# Patient Record
Sex: Male | Born: 1940 | Race: Black or African American | Hispanic: No | Marital: Single | Smoking: Never smoker
Health system: Southern US, Community
[De-identification: ages and names within clinical notes are randomized; demographics above are authoritative.]

## PROBLEM LIST (undated history)

## (undated) DIAGNOSIS — I10 Essential (primary) hypertension: Secondary | ICD-10-CM

## (undated) DIAGNOSIS — M109 Gout, unspecified: Secondary | ICD-10-CM

---

## 2005-09-20 ENCOUNTER — Other Ambulatory Visit: Payer: Self-pay

## 2005-09-20 ENCOUNTER — Inpatient Hospital Stay: Payer: Self-pay | Admitting: Internal Medicine

## 2005-09-21 ENCOUNTER — Other Ambulatory Visit: Payer: Self-pay

## 2005-10-30 ENCOUNTER — Other Ambulatory Visit: Payer: Self-pay

## 2005-10-30 ENCOUNTER — Inpatient Hospital Stay: Payer: Self-pay | Admitting: Internal Medicine

## 2005-10-31 ENCOUNTER — Other Ambulatory Visit: Payer: Self-pay

## 2006-02-26 ENCOUNTER — Emergency Department: Payer: Self-pay | Admitting: Emergency Medicine

## 2006-02-26 ENCOUNTER — Other Ambulatory Visit: Payer: Self-pay

## 2006-09-06 ENCOUNTER — Emergency Department: Payer: Self-pay | Admitting: Emergency Medicine

## 2006-09-06 ENCOUNTER — Other Ambulatory Visit: Payer: Self-pay

## 2007-11-01 IMAGING — CT CT CHEST W/ CM
1 series · 15 of 32 positions shown, 19 images · non-contrast
Comparison: none

REASON FOR EXAM: PE
COMMENTS:

[Series 4: soft tissue · axial · 0.74mm/px · z∈[-143,+163]mm · 15 of 116 slices shown, 19 images]
[im 9/116  mediastinal]
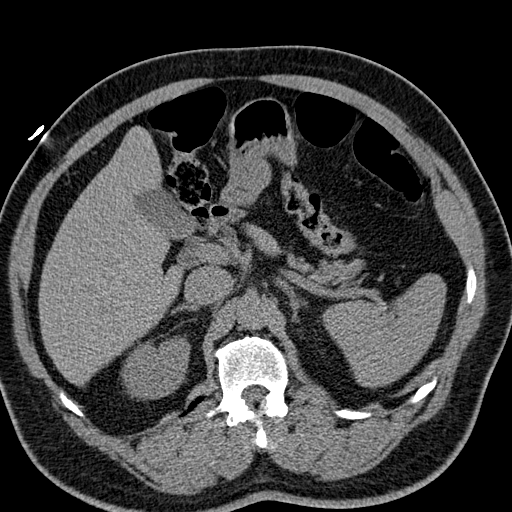
[im 9/116  lung]
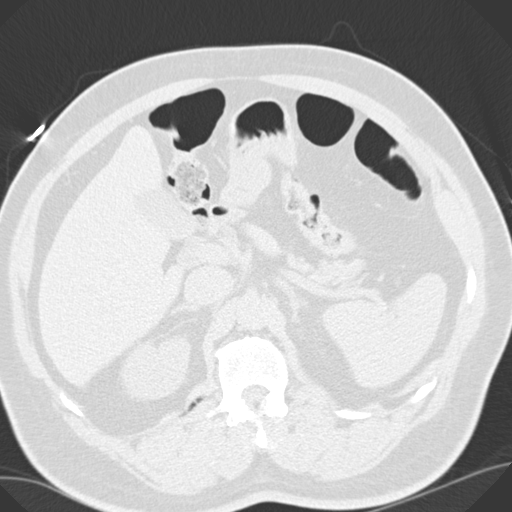
[im 18/116  lung]
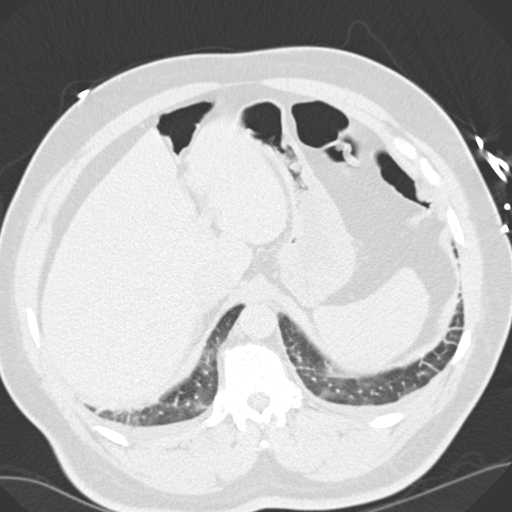
[im 24/116  lung]
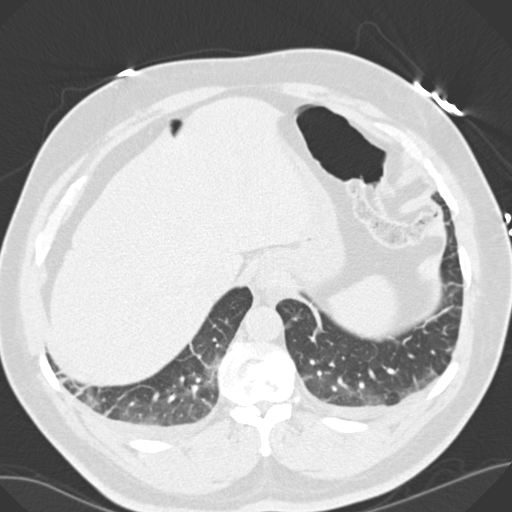
[im 30/116  lung]
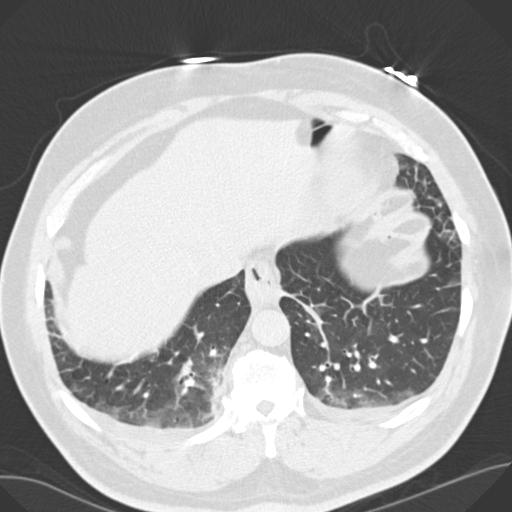
[im 39/116  mediastinal]
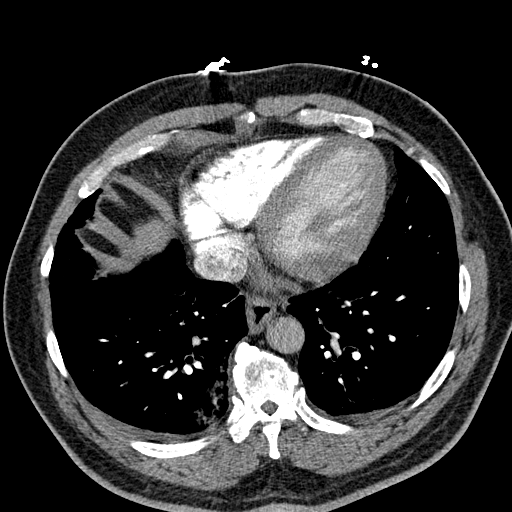
[im 39/116  lung]
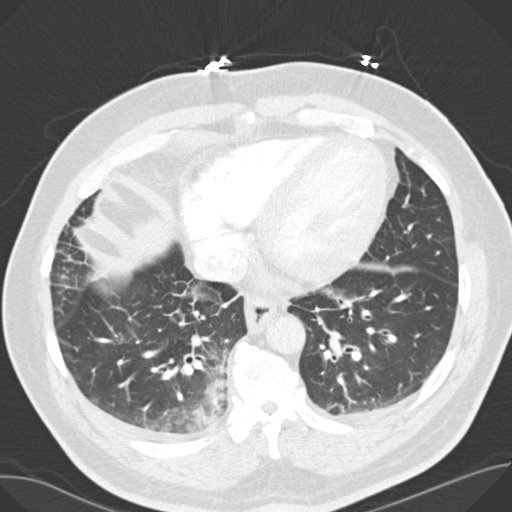
[im 47/116  lung]
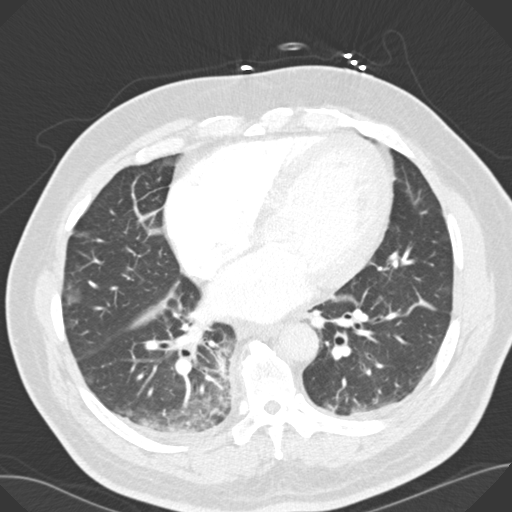
[im 56/116  lung]
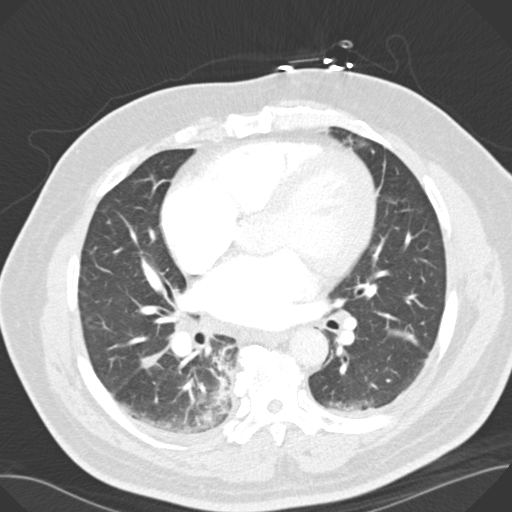
[im 61/116  lung]
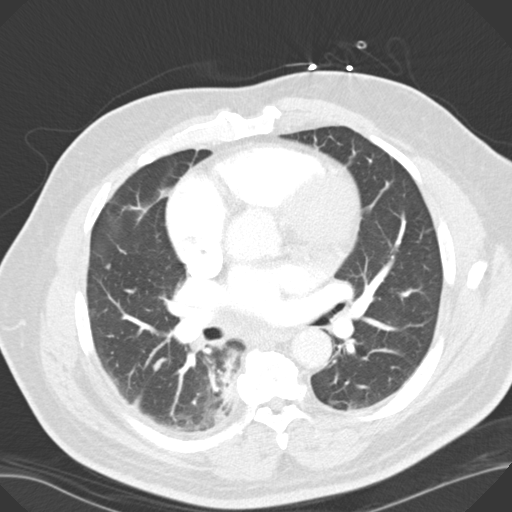
[im 69/116  mediastinal]
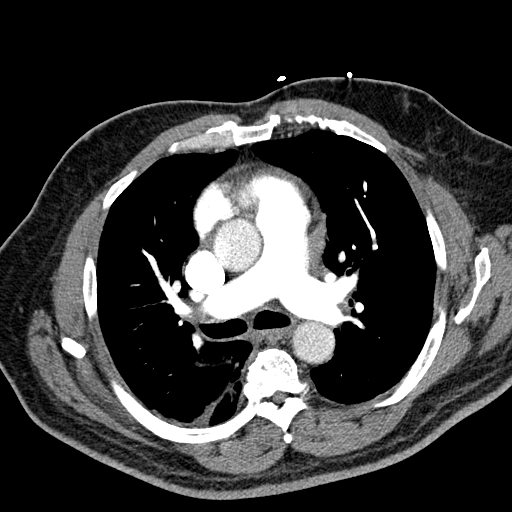
[im 69/116  lung]
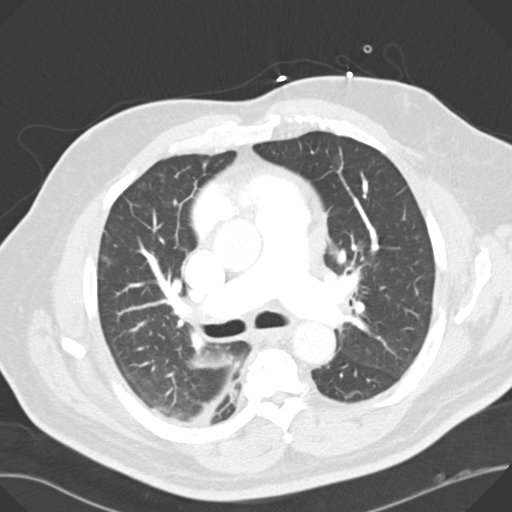
[im 73/116  lung]
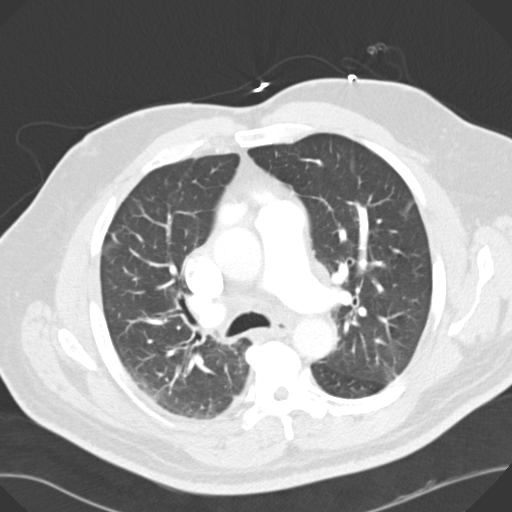
[im 81/116  lung]
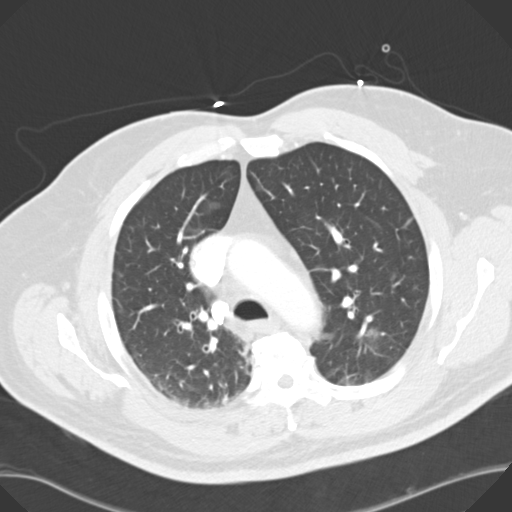
[im 90/116  lung]
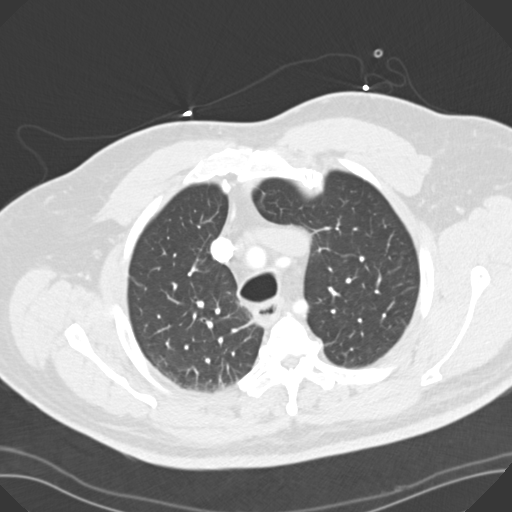
[im 94/116  mediastinal]
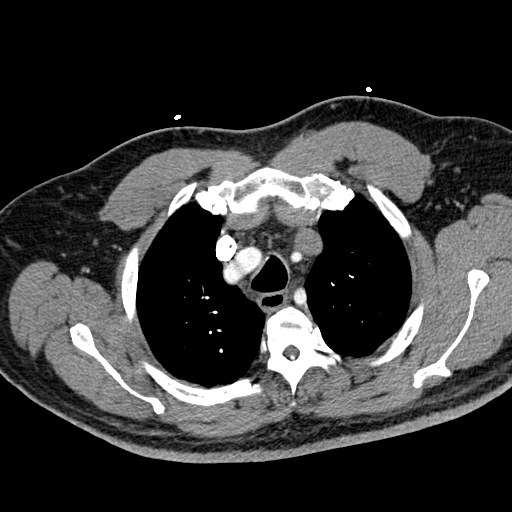
[im 94/116  lung]
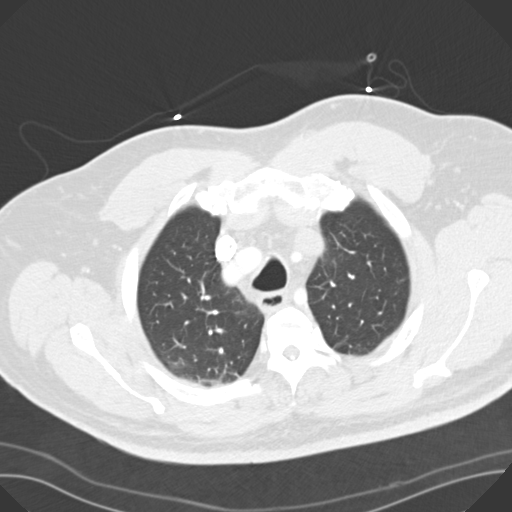
[im 103/116  lung]
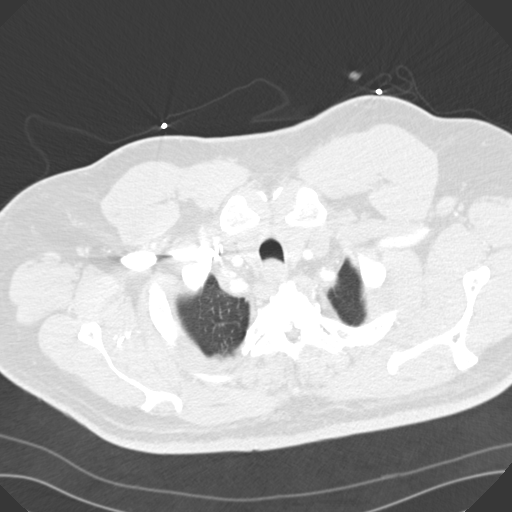
[im 111/116  lung]
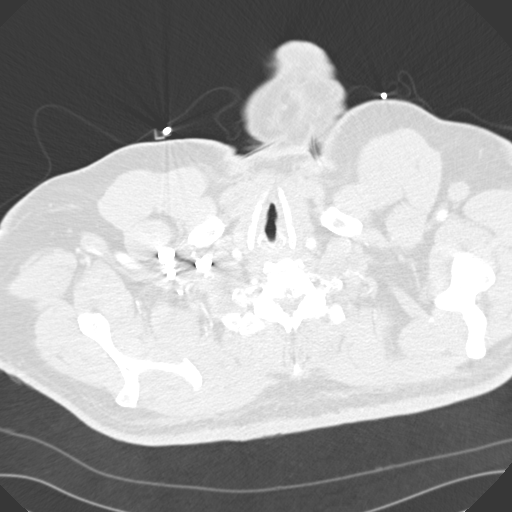

[15 of 32 positions shown; findings below may reference images not displayed]

PROCEDURE:     CT  - CT CHEST (FOR PE) W  - September 20, 2005  [DATE]

RESULT:          The patient is being evaluated for possible acute pulmonary
embolism.  The patient received 100 mL of Jsovue-TDS.

Contrast within the pulmonary arterial tree is normal in appearance.  I do
not see evidence of an acute pulmonary embolism.  The cardiac chambers are
top normal in size.  There is no pleural or pericardial effusion.  I see no
pathologic-sized mediastinal or hilar lymph nodes.  The caliber of the
thoracic aorta is normal.

At lung window settings, there is subpleural increased density in both lower
lobes, but this is predominantly on the RIGHT posteriorly.  A trace of
pleural fluid is noted in the costophrenic gutters.  I do not see alveolar
infiltrates.  Within the upper abdomen, the observed portions of the liver
are normal.  There are no adrenal masses.
IMPRESSION: 1.     I do not see evidence of acute pulmonary embolism.
2.     The cardiac chambers are top normal in size to mildly enlarged.
There is a trace of pleural fluid in both posterior costophrenic gutters.
3.     There is minimal atelectasis-interstitial infiltrate in the lower
lobes, especially on the RIGHT.
4.     There is a hiatal hernia present.

The findings were called to Dr. Thiery, in the emergency room, at the
conclusion of the study.

## 2007-12-11 IMAGING — CT CT LUMBAR SPINE WITHOUT CONTRAST
1 of 5 series · 2 of 14 positions shown, 3 images · non-contrast
Comparison: none

REASON FOR EXAM: pain/ weakness
COMMENTS:

[Series 606: range 5 · axial · 0.45mm/px · z∈[-417,-404]mm · 2 of 31 slices shown, 3 images]
[im 1/31  soft-tissue]
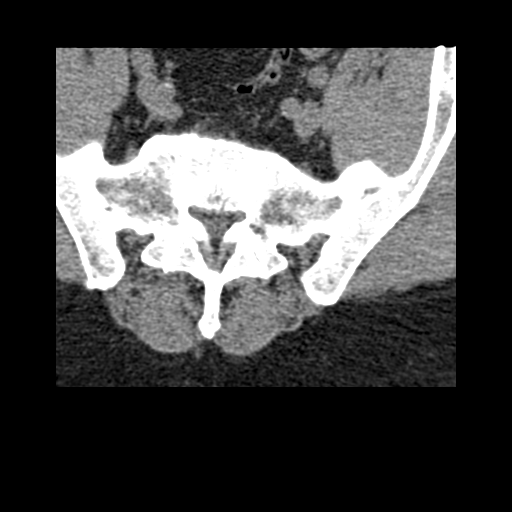
[im 1/31  bone]
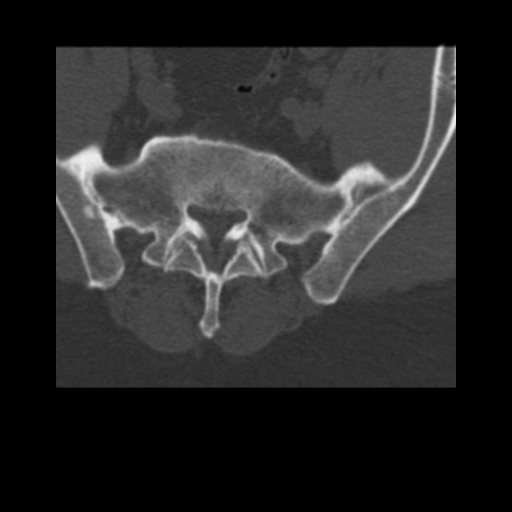
[im 16/31  bone]
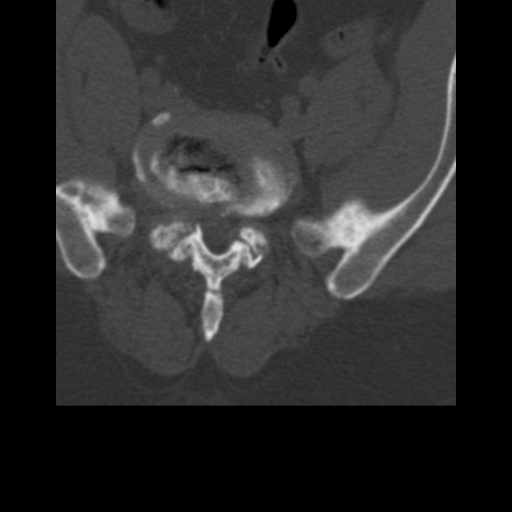

[2 of 14 positions shown; findings below may reference images not displayed]

PROCEDURE:     CT  - CT LUMBAR SPINE WO  - October 30, 2005  [DATE]

RESULT:        The patient is complaining of lower extremity weakness.

The lumbar vertebral bodies are preserved in height. There is disc space
narrowing at multiple levels with vacuum phenomenon seen at L3-L4. There are
Schmorl's nodes involving the inferior endplates of  L1 and L2. There is
endplate spurring seen at multiple levels, and there is facet joint
degenerative change present at multiple levels. These findings result in
moderate spinal stenosis and neural foraminal encroachment at multiple
levels. There is no discrete herniated nucleus pulposus identified. There
are rounded lucencies noted within the body of L5 that are nonspecific.
IMPRESSION: 1.     I do not see evidence of a compression fracture.
2.     There is degenerative disc disease and degenerative facet joint
change contributing to moderate degrees of spinal stenosis and neural
foraminal encroachment at multiple levels.
3.     There is lucency in the body of L5 that is nonspecific, but in the
face of any history of malignancy, the possibility of metastatic disease
should be considered. It would be of value to consider the patient for MRI
or nuclear bone scanning when he can tolerate procedure.

## 2008-06-20 ENCOUNTER — Ambulatory Visit: Payer: Self-pay | Admitting: Gastroenterology

## 2009-02-17 ENCOUNTER — Emergency Department: Payer: Self-pay | Admitting: Emergency Medicine

## 2012-04-25 ENCOUNTER — Emergency Department: Payer: Self-pay | Admitting: Emergency Medicine

## 2012-04-25 LAB — COMPREHENSIVE METABOLIC PANEL
Albumin: 4.1 g/dL (ref 3.4–5.0)
Alkaline Phosphatase: 108 U/L (ref 50–136)
Anion Gap: 5 — ABNORMAL LOW (ref 7–16)
BUN: 22 mg/dL — ABNORMAL HIGH (ref 7–18)
Bilirubin,Total: 0.6 mg/dL (ref 0.2–1.0)
Calcium, Total: 9.4 mg/dL (ref 8.5–10.1)
Chloride: 108 mmol/L — ABNORMAL HIGH (ref 98–107)
Co2: 25 mmol/L (ref 21–32)
EGFR (African American): >60
Glucose: 119 mg/dL — ABNORMAL HIGH (ref 65–99)
Osmolality: 280 (ref 275–301)
SGOT(AST): 38 U/L — ABNORMAL HIGH (ref 15–37)
Sodium: 138 mmol/L (ref 136–145)
Total Protein: 8.6 g/dL — ABNORMAL HIGH (ref 6.4–8.2)

## 2012-04-25 LAB — CBC
MCH: 30.1 pg (ref 26.0–34.0)
MCHC: 32.9 g/dL (ref 32.0–36.0)
RBC: 5.59 10*6/uL (ref 4.40–5.90)
RDW: 14 % (ref 11.5–14.5)
WBC: 10.7 10*3/uL — ABNORMAL HIGH (ref 3.8–10.6)

## 2012-04-25 LAB — URINALYSIS, COMPLETE
Bilirubin,UR: NEGATIVE
Glucose,UR: NEGATIVE mg/dL (ref 0–75)
Leukocyte Esterase: NEGATIVE
Nitrite: NEGATIVE
Protein: 100
RBC,UR: 11 /HPF (ref 0–5)
Squamous Epithelial: 2

## 2012-05-17 ENCOUNTER — Ambulatory Visit: Payer: Self-pay | Admitting: Urology

## 2012-11-23 ENCOUNTER — Emergency Department: Payer: Self-pay | Admitting: Internal Medicine

## 2012-11-23 LAB — CBC
HCT: 44.4 % (ref 40.0–52.0)
HGB: 14.7 g/dL (ref 13.0–18.0)
MCH: 29.4 pg (ref 26.0–34.0)
MCV: 89 fL (ref 80–100)
Platelet: 324 10*3/uL (ref 150–440)
RBC: 5 10*6/uL (ref 4.40–5.90)
WBC: 12.7 10*3/uL — ABNORMAL HIGH (ref 3.8–10.6)

## 2012-11-23 LAB — URINALYSIS, COMPLETE
Bacteria: NONE SEEN
Bilirubin,UR: NEGATIVE
Nitrite: NEGATIVE
Ph: 5 (ref 4.5–8.0)
Squamous Epithelial: <1
WBC UR: 14 /HPF (ref 0–5)

## 2012-11-23 LAB — BASIC METABOLIC PANEL
Calcium, Total: 9.9 mg/dL (ref 8.5–10.1)
Co2: 25 mmol/L (ref 21–32)
EGFR (Non-African Amer.): 47 — ABNORMAL LOW
Glucose: 134 mg/dL — ABNORMAL HIGH (ref 65–99)
Osmolality: 283 (ref 275–301)
Potassium: 3.2 mmol/L — ABNORMAL LOW (ref 3.5–5.1)
Sodium: 139 mmol/L (ref 136–145)

## 2012-11-23 LAB — TROPONIN I: Troponin-I: <0.02 ng/mL

## 2012-11-25 LAB — URINE CULTURE

## 2014-01-23 ENCOUNTER — Observation Stay: Payer: Self-pay | Admitting: Internal Medicine

## 2014-01-23 LAB — CBC
HCT: 46.7 % (ref 40.0–52.0)
HGB: 15.2 g/dL (ref 13.0–18.0)
MCH: 30 pg (ref 26.0–34.0)
MCHC: 32.7 g/dL (ref 32.0–36.0)
MCV: 92 fL (ref 80–100)
PLATELETS: 186 10*3/uL (ref 150–440)
RBC: 5.08 10*6/uL (ref 4.40–5.90)
RDW: 13.7 % (ref 11.5–14.5)
WBC: 16.2 10*3/uL — ABNORMAL HIGH (ref 3.8–10.6)

## 2014-01-23 LAB — COMPREHENSIVE METABOLIC PANEL
ALK PHOS: 89 U/L
AST: 49 U/L — AB (ref 15–37)
Albumin: 2.9 g/dL — ABNORMAL LOW (ref 3.4–5.0)
Anion Gap: 11 (ref 7–16)
BILIRUBIN TOTAL: 1.7 mg/dL — AB (ref 0.2–1.0)
BUN: 25 mg/dL — ABNORMAL HIGH (ref 7–18)
CREATININE: 1.89 mg/dL — AB (ref 0.60–1.30)
Calcium, Total: 9.5 mg/dL (ref 8.5–10.1)
Chloride: 102 mmol/L (ref 98–107)
Co2: 25 mmol/L (ref 21–32)
GFR CALC AF AMER: 45 — AB
GFR CALC NON AF AMER: 37 — AB
GLUCOSE: 131 mg/dL — AB (ref 65–99)
Osmolality: 282 (ref 275–301)
Potassium: 3.2 mmol/L — ABNORMAL LOW (ref 3.5–5.1)
SGPT (ALT): 35 U/L
SODIUM: 138 mmol/L (ref 136–145)
TOTAL PROTEIN: 8 g/dL (ref 6.4–8.2)

## 2014-01-23 LAB — TROPONIN I
Troponin-I: <0.02 ng/mL
Troponin-I: <0.02 ng/mL
Troponin-I: <0.02 ng/mL

## 2014-01-24 LAB — URINALYSIS, COMPLETE
BILIRUBIN, UR: NEGATIVE
Bacteria: NONE SEEN
Glucose,UR: NEGATIVE mg/dL (ref 0–75)
Ketone: NEGATIVE
LEUKOCYTE ESTERASE: NEGATIVE
Nitrite: NEGATIVE
PH: 5 (ref 4.5–8.0)
Specific Gravity: 1.02 (ref 1.003–1.030)
Squamous Epithelial: 1
WBC UR: 20 /HPF (ref 0–5)

## 2014-01-24 LAB — BASIC METABOLIC PANEL
Anion Gap: 7 (ref 7–16)
BUN: 41 mg/dL — AB (ref 7–18)
CHLORIDE: 102 mmol/L (ref 98–107)
CO2: 28 mmol/L (ref 21–32)
CREATININE: 1.71 mg/dL — AB (ref 0.60–1.30)
Calcium, Total: 9.4 mg/dL (ref 8.5–10.1)
EGFR (African American): 51 — ABNORMAL LOW
EGFR (Non-African Amer.): 42 — ABNORMAL LOW
Glucose: 103 mg/dL — ABNORMAL HIGH (ref 65–99)
Osmolality: 284 (ref 275–301)
POTASSIUM: 3.8 mmol/L (ref 3.5–5.1)
Sodium: 137 mmol/L (ref 136–145)

## 2014-01-24 LAB — MAGNESIUM: MAGNESIUM: 2.1 mg/dL

## 2014-01-25 LAB — BASIC METABOLIC PANEL
Anion Gap: 7 (ref 7–16)
BUN: 33 mg/dL — ABNORMAL HIGH (ref 7–18)
CALCIUM: 9.3 mg/dL (ref 8.5–10.1)
Chloride: 106 mmol/L (ref 98–107)
Co2: 25 mmol/L (ref 21–32)
Creatinine: 1.31 mg/dL — ABNORMAL HIGH (ref 0.60–1.30)
EGFR (African American): >60
GFR CALC NON AF AMER: 57 — AB
GLUCOSE: 110 mg/dL — AB (ref 65–99)
OSMOLALITY: 284 (ref 275–301)
POTASSIUM: 3.9 mmol/L (ref 3.5–5.1)
Sodium: 138 mmol/L (ref 136–145)

## 2014-01-25 LAB — CBC WITH DIFFERENTIAL/PLATELET
Basophil #: 0 10*3/uL (ref 0.0–0.1)
Basophil %: 0.6 %
EOS PCT: 2.3 %
Eosinophil #: 0.2 10*3/uL (ref 0.0–0.7)
HCT: 41.5 % (ref 40.0–52.0)
HGB: 13.4 g/dL (ref 13.0–18.0)
LYMPHS ABS: 1.7 10*3/uL (ref 1.0–3.6)
Lymphocyte %: 21.2 %
MCH: 30 pg (ref 26.0–34.0)
MCHC: 32.2 g/dL (ref 32.0–36.0)
MCV: 93 fL (ref 80–100)
MONO ABS: 1.2 x10 3/mm — AB (ref 0.2–1.0)
Monocyte %: 14.6 %
Neutrophil #: 5 10*3/uL (ref 1.4–6.5)
Neutrophil %: 61.3 %
PLATELETS: 164 10*3/uL (ref 150–440)
RBC: 4.46 10*6/uL (ref 4.40–5.90)
RDW: 13.5 % (ref 11.5–14.5)
WBC: 8.1 10*3/uL (ref 3.8–10.6)

## 2014-01-25 LAB — URINE CULTURE

## 2014-05-06 NOTE — Discharge Summary (Signed)
PATIENT NAME:  Jonathan Madden, Alister MR#:  [id number] DATE OF BIRTH:  1940/11/26  DATE OF ADMISSION:  01/23/2014 DATE OF DISCHARGE:  01/25/2014   PRIMARY CARE PHYSICIAN: Alden ServerErnest B. Maryellen PileEason, MD   DISCHARGE DIAGNOSIS:  1. Syncope, possibly due to hypotension, acute renal failure.  2. Acute renal failure on chronic kidney disease., Stage III.  3. Urinary tract infection with sepsis.  4. History of chronic systolic congestive heart failure with ejection fraction 40% to 45%. 5. Gout.  CONDITION: Stable.   CODE STATUS: Full code.   HOME MEDICATIONS: Please refer to the medication reconciliation list.   DIET: Low-sodium diet.   ACTIVITY: As tolerated.   FOLLOWUP CARE: Follow up with PCP within 1-2 weeks.   REASON FOR ADMISSION: Left foot pain and syncope.   HOSPITAL COURSE: The patient is a 74 year old African American male with a history of chronic systolic CHF, hypertension, gout, presented in the ED with left foot pain and syncope, episode of about 2 minutes. For a detailed history and physical examination, please refer to the admission note dictated by Dr. Elpidio AnisSudini.   LABORATORY DATA ON ADMISSION DATE: Showed glucose 140, BUN 25, creatinine 1.89. Electrolytes were normal, except potassium 3.2. Troponin was less than 0.02. WBC 16.2, hemoglobin 16.2. EKG showed atrial fibrillation. Chest x-ray: No acute abnormality.  1. Syncope, which is possibly due to hypotension and acute renal failure. After admission, the patient had been treated with IV fluid support. Lisinopril was discontinued. The patient has no complaints of weakness or dizziness.  2. Acute renal failure on chronic kidney disease, stage III. After IV fluid support, the patient's creatinine decreased to 1.31.  3. Gout. The patient has been treated with colchicine. The patient has no left foot pain.  4. Atrial fibrillation, which has been controlled with Lopressor. The patient has been treated with Xarelto.  5. History of chronic systolic  congestive heart failure, stable.  6. The patient has no complaints. Vital signs are stable. According to physical therapy evaluation, the patient needs to subacute rehabilitation placement I discussed the patient's discharge plan with the patient, nurse, case manager and social worker. The patient will be discharged to a skilled nursing facility today.   TIME SPENT: About 45 minutes.    ____________________________ Shaune PollackQing Khary Schaben, MD qc:mw D: 01/25/2014 11:57:50 ET T: 01/25/2014 12:19:10 ET JOB#: [id number]  cc: Shaune PollackQing Mahagony Grieb, MD, <Dictator> Shaune PollackQING Sharisse Rantz MD ELECTRONICALLY SIGNED 01/25/2014 13:07

## 2014-05-06 NOTE — Consult Note (Signed)
PATIENT NAME:  Jonathan Madden, Jonathan Madden MR#:  [id number] DATE OF BIRTH:  06-19-1940  DATE OF CONSULTATION:  01/24/2014  REFERRING PHYSICIAN:  Crissie FiguresEdavally N. Reddy, MD CONSULTING PHYSICIAN:  Marcina MillardAlexander Juliany Daughety, MD  PRIMARY CARE PHYSICIAN: Serita Shellerrnest B. Maryellen PileEason, MD  CARDIOLOGY CONSULTATION   CHIEF COMPLAINT: Gout.   REASON FOR CONSULTATION: Consultation requested for evaluation of syncope.   HISTORY OF PRESENT ILLNESS: The patient is a 74 year old gentleman with a history of dilated cardiomyopathy, chronic systolic congestive heart failure, and chronic atrial fibrillation. [facility] for gout of the left foot and pain. In the Emergency Room, the patient experienced diaphoresis and lightheadedness with an apparent syncopal episode without documentation of blood pressure, pulse or arrhythmia. The patient was admitted to telemetry where he has remained in atrial fibrillation with a controlled ventricular rate. The patient denies chest pain, shortness of breath, palpitations or heart racing.   Admission labs were notable for a BUN and creatinine of 25 and 1.89, respectively. The patient has ruled out for myocardial infarction by CPK, isoenzymes, and troponin.   PAST MEDICAL HISTORY:  1.  Dilated cardiomyopathy.  2.  Chronic systolic congestive heart failure.  3.  Hypertension.  4.  Hyperlipidemia.  5.  Chronic atrial fibrillation.   MEDICATIONS: Xarelto 15 mg daily, atorvastatin 20 mg daily, lisinopril 40 mg daily, metoprolol 25 mg b.i.d., meloxicam 15 mg daily, tamsulosin 0.4 mg daily.   REVIEW OF SYSTEMS:  CONSTITUTIONAL: No fever or chills.  EYES: No blurry vision.  EARS: No hearing loss.  RESPIRATORY: No shortness of breath.  CARDIOVASCULAR: No chest pain.  GASTROINTESTINAL: No nausea, vomiting, or diarrhea.  GENITOURINARY: No dysuria or hematuria.  ENDOCRINE: No polyuria or polydipsia.  MUSCULOSKELETAL: No arthralgias or myalgias.  NEUROLOGICAL: No focal muscle weakness or  numbness.  PSYCHOLOGICAL: No depression or anxiety.   PHYSICAL EXAMINATION:  VITAL SIGNS: Blood pressure 90/49, pulse 94, respirations 17, temperature 97.8, pulse oximetry 94%.  HEENT: Pupils equal, reactive to light and accommodation.  NECK: Supple without thyromegaly.  LUNGS: Clear.  HEART: Normal JVP. Normal PMI. Regular rate and rhythm. Normal S1, S2. No appreciable gallop, murmur, or rub.  ABDOMEN: Soft and nontender. Pulses were intact bilaterally.  MUSCULOSKELETAL: Normal muscle tone.  NEUROLOGIC: The patient is alert and oriented x3. Motor and sensory both grossly intact.   IMPRESSION: A 74 year old gentleman with dilated cardiomyopathy, chronic systolic congestive heart failure, who [facility], primarily for gout. He experience 2 minutes of lightheadedness with diaphoresis with non documented syncope, no evidence for arrhythmia, with negative troponin. The patient does have underlying dilated cardiomyopathy with reported EF of 25% and is a candidate for elective implantable cardiac defibrillator.   RECOMMENDATIONS:  1.  Agree with overall current therapy.  2.  IV fluid resuscitation.  3.  No further cardiac diagnostics at this time.  4.  Follow up as outpatient, at which time we will evaluate for elective implantable cardiac defibrillator.    ____________________________ Marcina MillardAlexander Miklos Bidinger, MD ap:MT D: 01/24/2014 09:11:37 ET T: 01/24/2014 09:23:39 ET JOB#: [id number]  cc: Marcina MillardAlexander Jodiann Ognibene, MD, <Dictator> Marcina MillardALEXANDER Shoaib Siefker MD ELECTRONICALLY SIGNED 01/24/2014 18:07

## 2014-05-06 NOTE — H&P (Signed)
PATIENT NAME:  Jonathan Madden, Jonathan Madden MR#:  [id number] DATE OF BIRTH:  1940-08-22  DATE OF ADMISSION:  01/23/2014  PRIMARY CARE PROVIDER: Mikeal Hawthorne. Brynda Greathouse, MD   CARDIOLOGIST: Loran Senters. Callwood, MD   CHIEF COMPLAINT: Left foot pain and syncope.   HISTORY OF PRESENTING ILLNESS: A 74 year old African American male patient with history of chronic systolic CHF with EF of 54%, CAD, hypertension, gout, presents to the Emergency Room initially with left foot pain of gout. The patient was waiting in the triage area when he had an episode of sweating, lightheadedness then syncope and passed out for 2 minutes. The patient did not have any seizure, no incontinence. Presently, he is back to normal and only complains of the left foot pain.   He has not had any recent change in medications. Has been doing well until this pain in the left foot started. No chest pain, palpitations. His last episode of syncope was a year prior when he was seen in the Emergency Room and discharged home.   Reviewing his old records, the patient had EF of 25% to 30%. It is unclear if he was evaluated for an AICD any time.   PAST MEDICAL HISTORY:  1.  Chronic systolic CHF.  2.  Hypertension.  3.  Hyperlipidemia.  4.  Hernia repair.  5.  Tachycardia-bradycardia syndrome.  6.  Atrial fibrillation.  7.  Herniated disk disease.   FAMILY HISTORY: CAD.   SOCIAL HISTORY: The patient does not smoke. No alcohol. No illicit drug use. Lives alone.   CODE STATUS: Full code.   HOME MEDICATIONS:  1.  Atorvastatin 20 mg daily.  2.  Lisinopril 40 mg daily.  3.  Meloxicam 15 mg daily.  4.  Metoprolol 25 mg 2 times a day.  5.  Tamsulosin 0.4 mg daily.  6.  Xarelto 15 mg daily.   REVIEW OF SYSTEMS: CONSTITUTIONAL: No fever, fatigue.  EYES: No blurred vision, pain, redness. EARS, NOSE, AND THROAT: No tinnitus, ear pain, hearing loss.  RESPIRATORY: No cough, wheeze, hemoptysis.  CARDIOVASCULAR: No chest pain, orthopnea, edema.   GASTROINTESTINAL: No nausea, vomiting, diarrhea, abdominal pain. GENITOURINARY: No dysuria, hematuria, frequency.  ENDOCRINE: No polyuria, nocturia, thyroid problems.  HEMATOLOGIC AND LYMPHATIC: No anemia, easy bruising, bleeding.  INTEGUMENTARY: No acne, rash, lesion.  MUSCULOSKELETAL: Has arthritis, back pain.  NEUROLOGIC: No focal numbness. Had syncope.  PSYCHIATRIC: No anxiety or depression.   ALLERGIES: No known drug allergies.   PHYSICAL EXAMINATION:  VITAL SIGNS: Shows temperature 97.7, pulse of 101, blood pressure initially low at 82/63, now improved to 99/65, saturating 100% on room air.  GENERAL: Obese African American male patient lying in bed, overall seems comfortable, conversational, cooperative with examination.  PSYCHIATRIC: Alert and oriented x 3. Mood and affect appropriate. Judgment intact.  HEENT: Atraumatic, normocephalic. Oral mucosa moist and pink. External ears and nose normal. No pallor. No icterus. Pupils bilaterally equal and reactive to light.  NECK: Supple. No thyromegaly. No palpable lymph nodes. Trachea midline. No carotid bruit or JVD. CARDIOVASCULAR: S1, S2, regular. Systolic murmur. Peripheral pulses 2+. No edema.  RESPIRATORY: Normal work of breathing. Clear to auscultation on both sides.  GASTROINTESTINAL: Soft abdomen, nontender. Bowel sounds present. No organomegaly palpable.  SKIN: Warm and dry. No petechiae, rash, ulcers.  MUSCULOSKELETAL: No joint swelling, redness, effusion of the large joints. Normal muscle tone.  NEUROLOGICAL: Motor strength 5/5 in upper extremities.  LYMPHATIC: No cervical lymphadenopathy.  EXTREMITIES: Left foot great toe swelling.   LABORATORY STUDIES: Show glucose  of 140, BUN 25, creatinine 1.89, sodium 138, potassium 3.2, chloride 102. AST, ALT, alk phos normal. Troponin less than 0.02. WBC 16.2, hemoglobin 15.2, platelets 186,000.   EKG shows atrial fibrillation, nothing acute.   Chest x-ray, portable, shows no acute  abnormalities. No vascular congestion, infiltrate, effusion.   ASSESSMENT AND PLAN:  1.  Syncope in a patient with hypotension on presentation, also his ejection fraction is less than 35%. At this time, the patient's syncope is likely secondary from dehydration and low blood pressure. Although arrhythmias need to be considered, considering his low ejection fraction, needs to be evaluated by cardiology for possible automatic implantable cardiac defibrillator placement or Holter. At this time, the patient will be hydrated with IV fluids and lisinopril held. 2.  For his hypertension and atrial fibrillation, we will continue his metoprolol and Xarelto.  3.  The patient does have chronic kidney disease stage 3. His creatinine is worse, from 1.3 to 1.8. We will repeat in the morning, check two more sets of cardiac enzymes, get an echocardiogram.  4.  Gout. We will give him 2 tablets of colchicine and put him on 0.6 mg of colchicine from tomorrow.  5.  Hypokalemia, mild. We will replace orally.  6.  Deep venous thrombosis prophylaxis. The patient is on Xarelto.   CODE STATUS: Full code.   TIME SPENT ON THIS CASE: Forty minutes.   ____________________________ Leia Alf Mykale Gandolfo, MD srs:TT D: 01/23/2014 21:26:07 ET T: 01/23/2014 22:05:35 ET JOB#: [id number]  cc: Alveta Heimlich R. Aiven Kampe, MD, <Dictator> Mikeal Hawthorne. Brynda Greathouse, MD Dwayne D. Clayborn Bigness, MD Neita Carp MD ELECTRONICALLY SIGNED 01/31/2014 16:55

## 2016-03-05 IMAGING — CR DG CHEST 1V PORT
1 series · 1 of 1 positions shown · non-contrast
Comparison: 11/23/2012

CLINICAL DATA: Shortness of breath

EXAM:
PORTABLE CHEST - 1 VIEW

[ap]
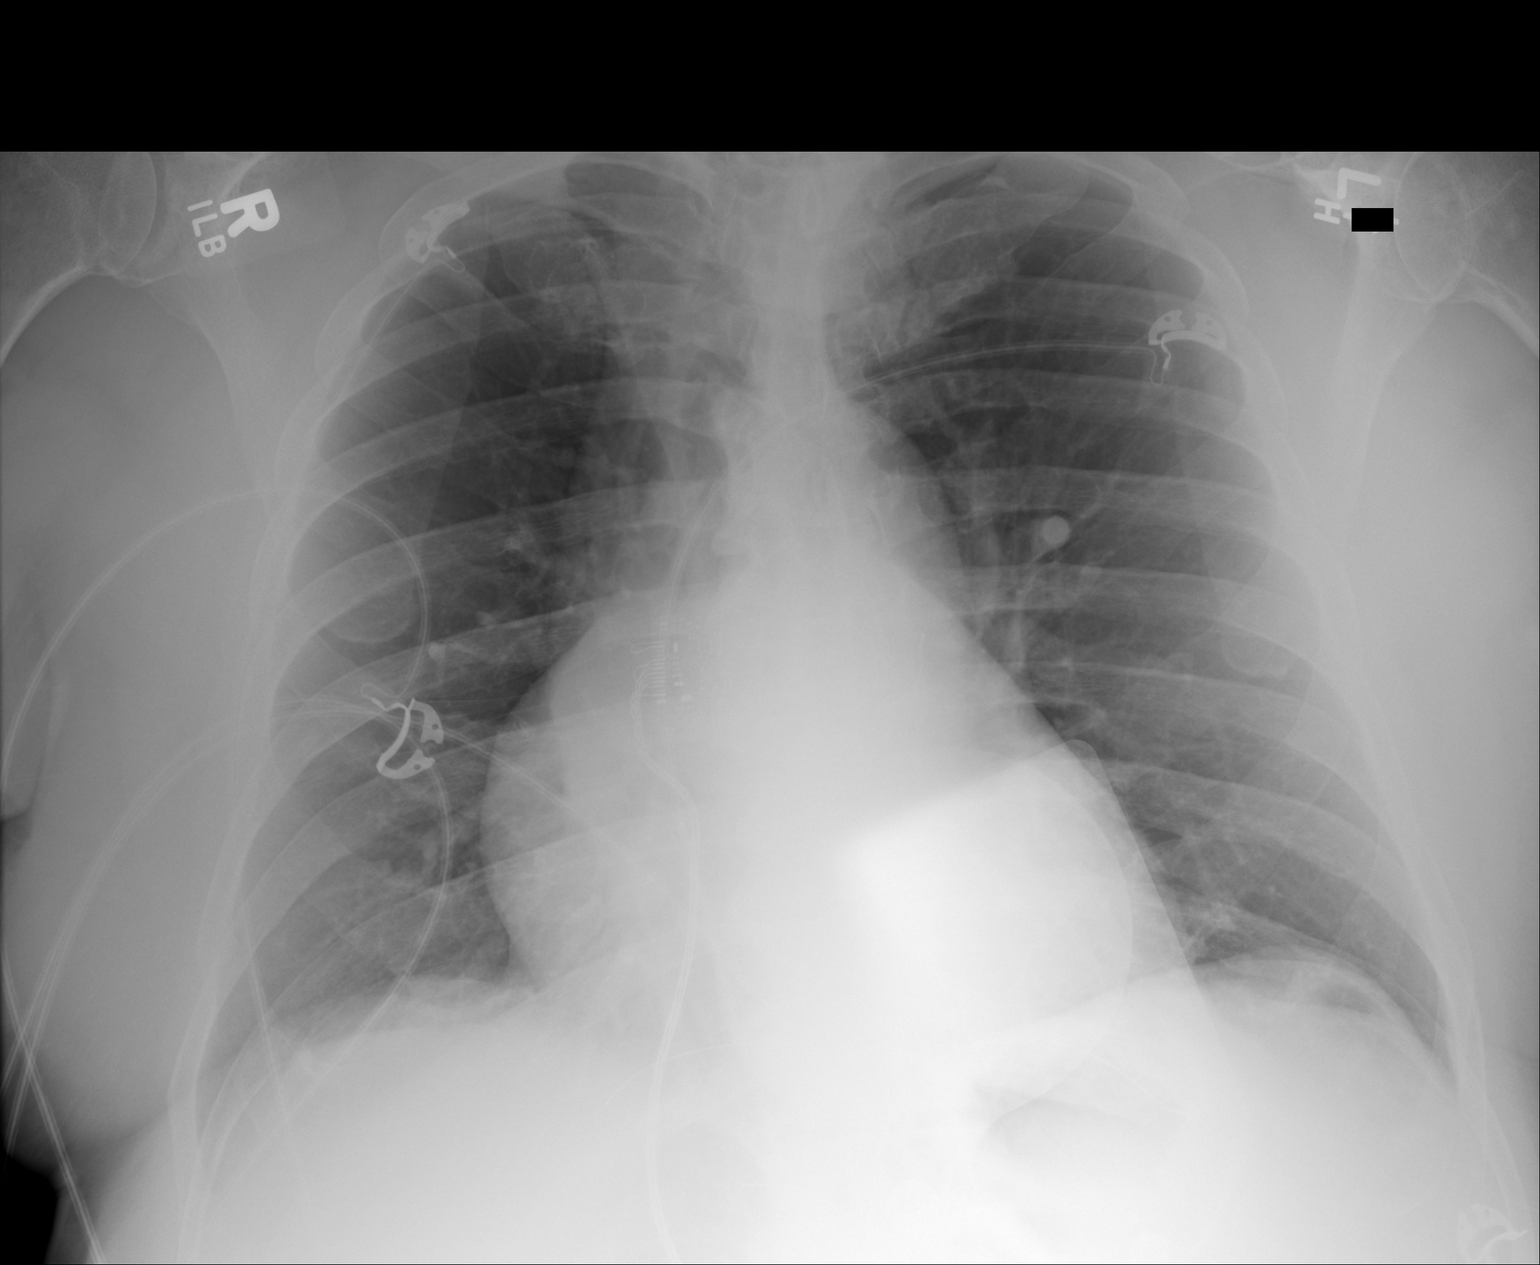

[1 of 1 positions shown; findings below may reference images not displayed]

FINDINGS: Cardiac shadow is enlarged. No focal infiltrate or sizable effusion
is seen. No significant vascular congestion is noted. No bony
abnormality is seen.
IMPRESSION: No acute abnormality noted.

## 2017-02-28 ENCOUNTER — Emergency Department: Payer: Medicare HMO

## 2017-02-28 ENCOUNTER — Encounter: Payer: Self-pay | Admitting: Emergency Medicine

## 2017-02-28 ENCOUNTER — Other Ambulatory Visit: Payer: Self-pay

## 2017-02-28 ENCOUNTER — Emergency Department
Admission: EM | Admit: 2017-02-28 | Discharge: 2017-02-28 | Disposition: A | Discharge status: Home / Self Care | Payer: Medicare HMO | Attending: Emergency Medicine | Admitting: Emergency Medicine

## 2017-02-28 DIAGNOSIS — R109 Unspecified abdominal pain: Secondary | ICD-10-CM | POA: Insufficient documentation

## 2017-02-28 DIAGNOSIS — M545 Low back pain: Secondary | ICD-10-CM | POA: Diagnosis not present

## 2017-02-28 DIAGNOSIS — M549 Dorsalgia, unspecified: Secondary | ICD-10-CM | POA: Diagnosis present

## 2017-02-28 DIAGNOSIS — I10 Essential (primary) hypertension: Secondary | ICD-10-CM | POA: Insufficient documentation

## 2017-02-28 DIAGNOSIS — G8929 Other chronic pain: Secondary | ICD-10-CM | POA: Insufficient documentation

## 2017-02-28 HISTORY — DX: Essential (primary) hypertension: I10

## 2017-02-28 HISTORY — DX: Gout, unspecified: M10.9

## 2017-02-28 LAB — URINALYSIS, ROUTINE W REFLEX MICROSCOPIC
BILIRUBIN URINE: NEGATIVE
Bacteria, UA: NONE SEEN
GLUCOSE, UA: NEGATIVE mg/dL
Ketones, ur: NEGATIVE mg/dL
LEUKOCYTES UA: NEGATIVE
NITRITE: NEGATIVE
PH: 5 (ref 5.0–8.0)
Protein, ur: 100 mg/dL — AB
SPECIFIC GRAVITY, URINE: 1.024 (ref 1.005–1.030)
SQUAMOUS EPITHELIAL / LPF: NONE SEEN

## 2017-02-28 MED ORDER — TRAMADOL HCL 50 MG PO TABS
50.0000 mg | ORAL_TABLET | Freq: Once | ORAL | Status: AC
  Administered 2017-02-28: 50 mg via ORAL
  Filled 2017-02-28: qty 1

## 2017-02-28 MED ORDER — TRAMADOL HCL 50 MG PO TABS: 50.0000 mg | ORAL_TABLET | Freq: Three times a day (TID) | ORAL | 0 refills | Status: DC | PRN

## 2017-02-28 NOTE — ED Notes (Signed)
FN: pt with back pain starting hurting yesterday.

## 2017-02-28 NOTE — Discharge Instructions (Signed)
Your exam, labs, and x-ray are essentially normal, at this time. There is no evidence of kidney infection, back fracture, or serious injury. Take the prescription pain medicine as needed. Follow-up with your provider for ongoing symptoms.

## 2017-02-28 NOTE — ED Provider Notes (Signed)
[facility] Emergency Department Provider Note ____________________________________________  Time seen: 1735  I have reviewed the triage vital signs and the nursing notes.  HISTORY  Chief Complaint  Back Pain  HPI Jonathan Tidwell Sr. is a 77 y.o. male to the ED accompanied by his adult son, for evaluation of acute on chronic back pain.  Patient describes pain that is currently resolved.  He did however note yesterday he began to have some pain on the left side of his flank.  He reports pain at this moment is resolved.  He describes since being in the ED his symptoms have resolved.  He denies any distal paresthesias, footdrop, or incontinence.  He does not take any medications for the pain at this time or recently.  He also denies any recent injury, accident, trauma, or falls.  Patient ambulates at home with use of a walker and cane.  He reports an enlarged prostate but denies any difficulty with urination, retention, or hematuria.  Past Medical History:  Diagnosis Date  . Gout   . Hypertension     There are no active problems to display for this patient.   History reviewed. No pertinent surgical history.  Prior to Admission medications   Medication Sig Start Date End Date Taking? Authorizing Provider  traMADol (ULTRAM) 50 MG tablet Take 1 tablet (50 mg total) by mouth 3 (three) times daily as needed. 02/28/17   Skyler Carel, Charlesetta Ivory, PA-C    Allergies Patient has no known allergies.  History reviewed. No pertinent family history.  Social History Social History   Tobacco Use  . Smoking status: Never Smoker  Substance Use Topics  . Alcohol use: No    Frequency: Never  . Drug use: No    Review of Systems  Constitutional: Negative for fever. Eyes: Negative for visual changes. ENT: Negative for sore throat. Cardiovascular: Negative for chest pain. Respiratory: Negative for shortness of breath. Gastrointestinal: Negative for abdominal pain, vomiting  and diarrhea. Genitourinary: Negative for dysuria. Musculoskeletal: Positive for back pain. Skin: Negative for rash. Neurological: Negative for headaches, focal weakness or numbness. ____________________________________________  PHYSICAL EXAM:  VITAL SIGNS: ED Triage Vitals  Enc Vitals Group     BP 02/28/17 1627 (!) 156/107     Pulse Rate 02/28/17 1627 92     Resp 02/28/17 1627 16     Temp 02/28/17 1627 99.1 F (37.3 C)     Temp src --      SpO2 02/28/17 1627 96 %     Weight 02/28/17 1627 232 lb (105.2 kg)     Height 02/28/17 1627 6' (1.829 m)     Head Circumference --      Peak Flow --      Pain Score 02/28/17 1636 10     Pain Loc --      Pain Edu? --      Excl. in GC? --     Constitutional: Alert and oriented. Well appearing and in no distress. Head: Normocephalic and atraumatic. Eyes: Conjunctivae are normal. PERRL. Normal extraocular movements Cardiovascular: Normal rate, regular rhythm. Normal distal pulses. Respiratory: Normal respiratory effort. No wheezes/rales/rhonchi. Gastrointestinal: Soft and nontender. No distention.  No CVA tenderness is appreciated. Musculoskeletal: Normal spinal alignment without midline tenderness, spasm, deformity, or step-off.  The patient transitions from sit to stand with assistance.  Nontender with normal range of motion in all extremities.  Neurologic: Cranial nerves II through XII grossly intact.  Negative seated straight leg raise.  Normal LE  DTRs bilaterally.  Normal gait without ataxia. Normal speech and language. No gross focal neurologic deficits are appreciated. ____________________________________________   LABS (pertinent positives/negatives) Labs Reviewed  URINALYSIS, ROUTINE W REFLEX MICROSCOPIC - Abnormal; Notable for the following components:      Result Value   Color, Urine AMBER (*)    APPearance HAZY (*)    Hgb urine dipstick SMALL (*)    Protein, ur 100 (*)    All other components within normal limits   ____________________________________________   RADIOLOGY  Lumbar Spine IMPRESSION: Advanced degenerative disc and facet disease. Grade 1 anterolisthesis of L5 on S1.  No acute bony abnormality.  I, Welma Mccombs, Charlesetta IvoryJenise V Bacon, personally viewed and evaluated these images (plain radiographs) as part of my medical decision making, as well as reviewing the written report by the radiologist. ____________________________________________  PROCEDURES  Procedures Ultram 50 mg PO ____________________________________________  INITIAL IMPRESSION / ASSESSMENT AND PLAN / ED COURSE  Patient with a ED evaluation of acute on chronic low back pain.  Patient reports his pain is completely resolved at the time of his exam.  His exam itself is overall benign without any acute neuromuscular deficit.  Patient is discharged after his x-rays are reviewed with son.  Patient verbalized understanding and are both reassured by the exam and x-ray findings.  Patient will be discharged with a prescription for Tamiflu to dose as directed.  He is encouraged to follow-up with his primary care provider next week for ongoing symptom management. ____________________________________________  FINAL CLINICAL IMPRESSION(S) / ED DIAGNOSES  Final diagnoses:  Acute exacerbation of chronic low back pain      Haze Antillon, Charlesetta IvoryJenise V Bacon, PA-C 03/01/17 0122    Dionne BucySiadecki, Sebastian, MD 03/02/17 2213

## 2017-02-28 NOTE — ED Triage Notes (Signed)
Pt reports that his back pain that began yesterday. He states that it hurts to get up and bend over. No new injury, does not hurt to urinate, denies seeing blood in his urine. Pt reports that his back hurts so bad that it hurts to walk and to get around.

## 2017-06-09 ENCOUNTER — Ambulatory Visit (INDEPENDENT_AMBULATORY_CARE_PROVIDER_SITE_OTHER): Payer: Medicare HMO

## 2017-06-09 ENCOUNTER — Ambulatory Visit: Payer: Medicare HMO | Admitting: Podiatry

## 2017-06-09 ENCOUNTER — Encounter: Payer: Self-pay | Admitting: Podiatry

## 2017-06-09 VITALS — BP 180/85 | HR 91 | Resp 16

## 2017-06-09 DIAGNOSIS — M7752 Other enthesopathy of left foot: Secondary | ICD-10-CM

## 2017-06-09 DIAGNOSIS — M7751 Other enthesopathy of right foot: Secondary | ICD-10-CM

## 2017-06-09 DIAGNOSIS — M779 Enthesopathy, unspecified: Secondary | ICD-10-CM

## 2017-06-09 DIAGNOSIS — M79676 Pain in unspecified toe(s): Secondary | ICD-10-CM

## 2017-06-09 DIAGNOSIS — M778 Other enthesopathies, not elsewhere classified: Secondary | ICD-10-CM

## 2017-06-09 DIAGNOSIS — B351 Tinea unguium: Secondary | ICD-10-CM

## 2017-06-09 NOTE — Progress Notes (Signed)
  Subjective:  Patient ID: Jonathan Pinksavid Graciano Sr., male    DOB: February 15, 1940,  MRN: [id number] HPI Chief Complaint  Patient presents with  . 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 New Patient (Initial Visit)    77 y.o. male presents with the above complaint.   ROS: Denies fever chills nausea vomiting muscle aches pains calf pain back pain chest pain shortness of breath.  Past Medical History:  Diagnosis Date  . Gout   . Hypertension    No past surgical history on file.  Current Outpatient Medications:  .  amLODipine-benazepril (LOTREL) 10-40 MG capsule, , Disp: , Rfl:  .  colchicine 0.6 MG tablet, , Disp: , Rfl: 0 .  ELIQUIS 5 MG TABS tablet, , Disp: , Rfl: 2 .  lisinopril (PRINIVIL,ZESTRIL) 40 MG tablet, , Disp: , Rfl: 3 .  metoprolol succinate (TOPROL-XL) 50 MG 24 hr tablet, , Disp: , Rfl: 2  No Known Allergies Review of Systems Objective:   Vitals:   06/09/17 0839  BP: (!) 180/85  Pulse: 91  Resp: 16    General: Well developed, nourished, in no acute distress, alert and oriented x3   Dermatological: Skin is warm, dry and supple bilateral. Nails x 10 are not well maintained; and that they are thick yellow dystrophic clinically mycotic painful palpation as well as debridement.  Remaining integument appears unremarkable at this time. There are no open sores, no preulcerative lesions, no rash or signs of infection present.  Vascular: Dorsalis Pedis artery and Posterior Tibial artery pedal pulses are 2/4 bilateral with immedate capillary fill time. Pedal hair growth present. No varicosities and no lower extremity edema present bilateral.   Neruologic: Grossly intact via light touch bilateral. Vibratory intact via tuning fork bilateral. Protective threshold with Semmes Wienstein monofilament intact to all pedal sites bilateral. Patellar and Achilles deep tendon  reflexes 2+ bilateral. No Babinski or clonus noted bilateral.   Musculoskeletal: No gross boney pedal deformities bilateral. No pain, crepitus, or limitation noted with foot and ankle range of motion bilateral. Muscular strength 5/5 in all groups tested bilateral.  Painful hypertrophic first metatarsal phalangeal joints painful on range of motion.  Gait: Unassisted, Nonantalgic.    Radiographs:  Demonstrates osseously mature individual with significant gout attacks joint space narrowing subchondral sclerosis Martell sign about the joint.  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  Plan: Discussed etiology pathology conservative versus surgical therapies after sterile Betadine skin prep I injected around the first metatarsophalangeal joint bilaterally with 20 mg Kenalog 5 mg Marcaine per foot.  Tolerated procedure well without complications.  Also debrided toenails 1 through 5 bilateral cover service.  He will follow-up with Dr. Stacie AcresMayer for this.     Jonathan Madden T. BenningtonHyatt, [place]PM

## 2017-06-11 ENCOUNTER — Ambulatory Visit: Payer: Self-pay

## 2017-07-02 ENCOUNTER — Ambulatory Visit: Payer: Self-pay | Admitting: Urology

## 2017-07-02 ENCOUNTER — Encounter: Payer: Self-pay | Admitting: Urology

## 2017-07-05 DEATH — deceased

## 2017-09-09 ENCOUNTER — Ambulatory Visit: Payer: Medicare HMO | Admitting: Podiatry
